# Patient Record
Sex: Male | Born: 1985 | Race: White | Hispanic: No | Marital: Married | Smoking: Former smoker
Health system: Southern US, Community
[De-identification: ages and names within clinical notes are randomized; demographics above are authoritative.]

## PROBLEM LIST (undated history)

## (undated) DIAGNOSIS — F329 Major depressive disorder, single episode, unspecified: Secondary | ICD-10-CM

## (undated) DIAGNOSIS — R519 Headache, unspecified: Secondary | ICD-10-CM

## (undated) DIAGNOSIS — F419 Anxiety disorder, unspecified: Secondary | ICD-10-CM

## (undated) DIAGNOSIS — R51 Headache: Secondary | ICD-10-CM

## (undated) DIAGNOSIS — F32A Depression, unspecified: Secondary | ICD-10-CM

## (undated) HISTORY — PX: OTHER SURGICAL HISTORY: SHX169

---

## 2014-06-06 ENCOUNTER — Other Ambulatory Visit: Payer: Self-pay | Admitting: Family Medicine

## 2014-06-06 DIAGNOSIS — R1030 Lower abdominal pain, unspecified: Secondary | ICD-10-CM

## 2014-06-07 ENCOUNTER — Ambulatory Visit
Admission: RE | Admit: 2014-06-07 | Discharge: 2014-06-07 | Disposition: A | Discharge status: Home / Self Care | Payer: No Typology Code available for payment source | Source: Ambulatory Visit | Attending: Family Medicine | Admitting: Family Medicine

## 2014-06-07 DIAGNOSIS — R1030 Lower abdominal pain, unspecified: Secondary | ICD-10-CM

## 2014-06-17 ENCOUNTER — Ambulatory Visit (INDEPENDENT_AMBULATORY_CARE_PROVIDER_SITE_OTHER): Payer: Self-pay | Admitting: General Surgery

## 2014-06-17 NOTE — H&P (Signed)
History of Present Illness Paul Filler MD; 06/17/2014 10:59 AM) Patient words: evaluate pssible bilateral inguinal hernia's.  The patient is a 29 year old male who presents with an inguinal hernia. The patient is a 29 year old male who is referred by Dr. Orvan July for an evaluation of bilateral inguinal hernias. 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 Patient is a he's had some pain both the right and left side. Patient on ultrasound which revealed a right greater than left inguinal hernia.   Other Problems Paul Stout, [place]; 06/17/2014 10:30 AM) No pertinent past medical history  Past Surgical History Paul Stout, [place]; 06/17/2014 10:30 AM) Oral Surgery  Diagnostic Studies History Paul Stout, [place]; 06/17/2014 10:30 AM) Colonoscopy never  Allergies Paul Stout, [place]; 06/17/2014 10:45 AM) No Known Drug Allergies03/10/2014  Medication History Paul Stout, [place]; 06/17/2014 10:45 AM) Ibuprofen (  Tablet, Oral) Active. (as needed for pain)  Social History Paul Stout, [place]; 06/17/2014 10:30 AM) Alcohol use Occasional alcohol use. Caffeine use Coffee. No drug use Tobacco use Never smoker.  Family History Paul Stout, [place]; 06/17/2014 10:30 AM) Family history unknown First Degree Relatives  Review of Systems Paul [place]; 06/17/2014 10:30 AM) General Not Present- Appetite Loss, Chills, Fatigue, Fever, Night Sweats, Weight Gain and Weight Loss. Skin Not Present- Change in Wart/Mole, Dryness, Hives, Jaundice, New Lesions, Non-Healing Wounds, Rash and Ulcer. HEENT Not Present- Earache, Hearing Loss, Hoarseness, Nose Bleed, Oral Ulcers, Ringing in the Ears, Seasonal Allergies, Sinus Pain, Sore Throat, Visual Disturbances, Wears glasses/contact lenses and Yellow Eyes. Respiratory Not Present- Bloody sputum, Chronic Cough, Difficulty Breathing, Snoring and Wheezing. Breast Not Present- Breast  Mass, Breast Pain, Nipple Discharge and Skin Changes. Cardiovascular Not Present- Chest Pain, Difficulty Breathing Lying Down, Leg Cramps, Palpitations, Rapid Heart Rate, Shortness of Breath and Swelling of Extremities. Gastrointestinal Present- Bloating and Excessive gas. Not Present- Abdominal Pain, Bloody Stool, Change in Bowel Habits, Chronic diarrhea, Constipation, Difficulty Swallowing, Gets full quickly at meals, Hemorrhoids, Indigestion, Nausea, Rectal Pain and Vomiting. Male Genitourinary Not Present- Blood in Urine, Change in Urinary Stream, Frequency, Impotence, Nocturia, Painful Urination, Urgency and Urine Leakage. Musculoskeletal Not Present- Back Pain, Joint Pain, Joint Stiffness, Muscle Pain, Muscle Weakness and Swelling of Extremities. Neurological Not Present- Decreased Memory, Fainting, Headaches, Numbness, Seizures, Tingling, Tremor, Trouble walking and Weakness. Psychiatric Not Present- Anxiety, Bipolar, Change in Sleep Pattern, Depression, Fearful and Frequent crying. Endocrine Not Present- Cold Intolerance, Excessive Hunger, Hair Changes, Heat Intolerance, Hot flashes and New Diabetes. Hematology Not Present- Easy Bruising, Excessive bleeding, Gland problems, HIV and Persistent Infections.   Vitals Paul Puls MA; 06/17/2014 10:45 AM) 06/17/2014 10:44 AM Weight: 167.8 lb Height: 71in Body Surface Area: 1.95 m Body Mass Index: 23.4 kg/m Temp.: 97.47F(Temporal)  Pulse: 66 (Regular)  Resp.: 16 (Unlabored)  BP: 124/86 (Sitting, Left Arm, Standard)    Physical Exam Paul Filler MD; 06/17/2014 10:59 AM) General Mental Status-Alert. General Appearance-Consistent with stated age. Hydration-Well hydrated. Voice-Normal.  Head and Neck Head-normocephalic, atraumatic with no lesions or palpable masses. Trachea-midline. Thyroid Gland Characteristics - normal size and consistency.  Chest and Lung Exam Chest and lung exam reveals -quiet, even  and easy respiratory effort with no use of accessory muscles and on auscultation, normal breath sounds, no adventitious sounds and normal vocal resonance. Inspection Chest Wall - Normal. Back - normal.  Cardiovascular Cardiovascular examination reveals -normal heart sounds, regular rate and rhythm with no murmurs and normal  pedal pulses bilaterally.  Abdomen Inspection Skin - Scar - no surgical scars. Hernias - Inguinal hernia - Bilateral - Reducible(small). Palpation/Percussion Normal exam - Soft, Non Tender, No Rebound tenderness, No Rigidity (guarding) and No hepatosplenomegaly. Auscultation Normal exam - Bowel sounds normal.    Assessment & Plan Paul Stout(Joachim Carton MD; 06/17/2014 11:00 AM) BILATERAL INGUINAL HERNIA WITHOUT OBSTRUCTION OR GANGRENE, RECURRENCE NOT SPECIFIED (550.92  K40.20) Impression: 29 year old male with bilateral inguinal hernias right greater than left  1. Will proceed to the operating for laparoscopic bilateral inguinal hernia repair with mesh. 2. All risks and benefits were discussed with the patient to generally include, but not limited to: infection, bleeding, damage to surrounding structures, acute and chronic nerve pain, and recurrence. Alternatives were offered and described. All questions were answered and the patient voiced understanding of the procedure and wishes to proceed at this point with hernia repair.

## 2014-06-19 ENCOUNTER — Encounter (HOSPITAL_COMMUNITY): Payer: Self-pay

## 2014-06-19 ENCOUNTER — Encounter (HOSPITAL_COMMUNITY)
Admission: RE | Admit: 2014-06-19 | Discharge: 2014-06-19 | Disposition: A | Discharge status: Home / Self Care | Payer: No Typology Code available for payment source | Source: Ambulatory Visit | Attending: General Surgery | Admitting: General Surgery

## 2014-06-19 DIAGNOSIS — K4020 Bilateral inguinal hernia, without obstruction or gangrene, not specified as recurrent: Secondary | ICD-10-CM | POA: Diagnosis not present

## 2014-06-19 DIAGNOSIS — Z01818 Encounter for other preprocedural examination: Secondary | ICD-10-CM | POA: Insufficient documentation

## 2014-06-19 HISTORY — DX: Headache, unspecified: R51.9

## 2014-06-19 HISTORY — DX: Headache: R51

## 2014-06-19 HISTORY — DX: Anxiety disorder, unspecified: F41.9

## 2014-06-19 HISTORY — DX: Major depressive disorder, single episode, unspecified: F32.9

## 2014-06-19 HISTORY — DX: Depression, unspecified: F32.A

## 2014-06-19 LAB — CBC
HCT: 42 % (ref 39.0–52.0)
Hemoglobin: 14.5 g/dL (ref 13.0–17.0)
MCH: 29.3 pg (ref 26.0–34.0)
MCHC: 34.5 g/dL (ref 30.0–36.0)
MCV: 84.8 fL (ref 78.0–100.0)
Platelets: 173 10*3/uL (ref 150–400)
RBC: 4.95 MIL/uL (ref 4.22–5.81)
RDW: 12.4 % (ref 11.5–15.5)
WBC: 5.6 10*3/uL (ref 4.0–10.5)

## 2014-06-19 NOTE — Pre-Procedure Instructions (Signed)
Paul Stout  06/19/2014   Your procedure is scheduled on:  06/24/2014  [facility] at [facility]:30 AM.  Call this number if you have problems the morning of surgery: [phone]   Remember:   Do not eat food or drink liquids after midnight.  On SUNDAY   Take these medicines the morning of surgery with A SIP OF WATER:  Nothing   Do not wear jewelry    Do not wear lotions, powders, or perfumes. You may wear deodorant.    Men may shave face and neck.   Do not bring valuables to the hospital.  [facility] is not responsible for any belongings or valuables.               Contacts, dentures or bridgework may not be worn into surgery.   Leave suitcase in the car. After surgery it may be brought to your room.   For patients admitted to the hospital, discharge time is determined by your treatment team.                Patients discharged the day of surgery will not be allowed to drive home.  Name and phone number of your driver:   Special Instructions: Special Instructions: [facility] - Preparing for Surgery  Before surgery, you can play an important role.  Because skin is not sterile, your skin needs to be as free of germs as possible.  You can reduce the number of germs on you skin by washing with CHG (chlorahexidine gluconate) soap before surgery.  CHG is an antiseptic cleaner which kills germs and bonds with the skin to continue killing germs even after washing.  Please DO NOT use if you have an allergy to CHG or antibacterial soaps.  If your skin becomes reddened/irritated stop using the CHG and inform your nurse when you arrive at Short Stay.  Do not shave (including legs and underarms) for at least 48 hours prior to the first CHG shower.  You may shave your face.  Please follow these instructions carefully:   1.  Shower with CHG Soap the night before surgery and the  morning of Surgery.  2.  If you choose to wash your hair, wash your hair first as usual with  your  normal shampoo.  3.  After you shampoo, rinse your hair and body thoroughly to remove the  Shampoo.  4.  Use CHG as you would any other liquid soap.  You can apply chg directly to the skin and wash gently with scrungie or a clean washcloth.  5.  Apply the CHG Soap to your body ONLY FROM THE NECK DOWN.    Do not use on open wounds or open sores.  Avoid contact with your eyes, ears, mouth and genitals (private parts).  Wash genitals (private parts)   with your normal soap.  6.  Wash thoroughly, paying special attention to the area where your surgery will be performed.  7.  Thoroughly rinse your body with warm water from the neck down.  8.  DO NOT shower/wash with your normal soap after using and rinsing off   the CHG Soap.  9.  Pat yourself dry with a clean towel.            10.  Wear clean pajamas.            11 .  Place clean sheets on your bed the night of your first shower and do not sleep with  pets.  Day of Surgery  Do not apply any lotions/deodorants the morning of surgery.  Please wear clean clothes to the hospital/surgery center.   Please read over the following fact sheets that you were given: Pain Booklet, Coughing and Deep Breathing and Surgical Site Infection Prevention

## 2014-06-23 MED ORDER — CEFAZOLIN SODIUM-DEXTROSE 2-3 GM-% IV SOLR
2.0000 g | INTRAVENOUS | Status: AC
  Administered 2014-06-24: 2 g via INTRAVENOUS
  Filled 2014-06-23: qty 50

## 2014-06-24 ENCOUNTER — Ambulatory Visit (HOSPITAL_COMMUNITY)
Admission: RE | Admit: 2014-06-24 | Discharge: 2014-06-24 | Disposition: A | Discharge status: Home / Self Care | Payer: No Typology Code available for payment source | Source: Ambulatory Visit | Attending: General Surgery | Admitting: General Surgery

## 2014-06-24 ENCOUNTER — Ambulatory Visit (HOSPITAL_COMMUNITY): Payer: No Typology Code available for payment source | Admitting: Anesthesiology

## 2014-06-24 ENCOUNTER — Encounter (HOSPITAL_COMMUNITY)
Admission: RE | Disposition: A | Discharge status: Home / Self Care | Payer: Self-pay | Source: Ambulatory Visit | Attending: General Surgery

## 2014-06-24 ENCOUNTER — Encounter (HOSPITAL_COMMUNITY): Payer: Self-pay | Admitting: *Deleted

## 2014-06-24 DIAGNOSIS — K4020 Bilateral inguinal hernia, without obstruction or gangrene, not specified as recurrent: Secondary | ICD-10-CM | POA: Diagnosis not present

## 2014-06-24 DIAGNOSIS — Z791 Long term (current) use of non-steroidal anti-inflammatories (NSAID): Secondary | ICD-10-CM | POA: Insufficient documentation

## 2014-06-24 DIAGNOSIS — Z87891 Personal history of nicotine dependence: Secondary | ICD-10-CM | POA: Insufficient documentation

## 2014-06-24 HISTORY — PX: INGUINAL HERNIA REPAIR: SHX194

## 2014-06-24 HISTORY — PX: INSERTION OF MESH: SHX5868

## 2014-06-24 SURGERY — REPAIR, HERNIA, INGUINAL, LAPAROSCOPIC
Anesthesia: General | Site: Groin | Laterality: Bilateral

## 2014-06-24 MED ORDER — BUPIVACAINE HCL (PF) 0.25 % IJ SOLN
INTRAMUSCULAR | Status: AC
  Filled 2014-06-24: qty 30

## 2014-06-24 MED ORDER — NEOSTIGMINE METHYLSULFATE 10 MG/10ML IV SOLN
INTRAVENOUS | Status: AC
  Filled 2014-06-24: qty 1

## 2014-06-24 MED ORDER — GLYCOPYRROLATE 0.2 MG/ML IJ SOLN
INTRAMUSCULAR | Status: AC
  Filled 2014-06-24: qty 1

## 2014-06-24 MED ORDER — PROPOFOL 10 MG/ML IV BOLUS
INTRAVENOUS | Status: DC | PRN
  Administered 2014-06-24: 200 mg via INTRAVENOUS

## 2014-06-24 MED ORDER — SODIUM CHLORIDE 0.9 % IJ SOLN: 3.0000 mL | INTRAMUSCULAR | Status: DC | PRN

## 2014-06-24 MED ORDER — MIDAZOLAM HCL 2 MG/2ML IJ SOLN
INTRAMUSCULAR | Status: AC
  Filled 2014-06-24: qty 2

## 2014-06-24 MED ORDER — LACTATED RINGERS IV SOLN
INTRAVENOUS | Status: DC | PRN
  Administered 2014-06-24 (×2): via INTRAVENOUS

## 2014-06-24 MED ORDER — HYDROMORPHONE HCL 1 MG/ML IJ SOLN
INTRAMUSCULAR | Status: AC
  Filled 2014-06-24: qty 1

## 2014-06-24 MED ORDER — PHENYLEPHRINE 40 MCG/ML (10ML) SYRINGE FOR IV PUSH (FOR BLOOD PRESSURE SUPPORT)
PREFILLED_SYRINGE | INTRAVENOUS | Status: AC
  Filled 2014-06-24: qty 10

## 2014-06-24 MED ORDER — ACETAMINOPHEN 650 MG RE SUPP: 650.0000 mg | RECTAL | Status: DC | PRN

## 2014-06-24 MED ORDER — ROCURONIUM BROMIDE 50 MG/5ML IV SOLN
INTRAVENOUS | Status: AC
  Filled 2014-06-24: qty 1

## 2014-06-24 MED ORDER — LIDOCAINE HCL (CARDIAC) 20 MG/ML IV SOLN
INTRAVENOUS | Status: DC | PRN
  Administered 2014-06-24: 100 mg via INTRATRACHEAL
  Administered 2014-06-24: 30 mg via INTRAVENOUS

## 2014-06-24 MED ORDER — LIDOCAINE HCL (CARDIAC) 20 MG/ML IV SOLN
INTRAVENOUS | Status: AC
  Filled 2014-06-24: qty 5

## 2014-06-24 MED ORDER — DEXAMETHASONE SODIUM PHOSPHATE 4 MG/ML IJ SOLN
INTRAMUSCULAR | Status: DC | PRN
  Administered 2014-06-24: 4 mg via INTRAVENOUS

## 2014-06-24 MED ORDER — GLYCOPYRROLATE 0.2 MG/ML IJ SOLN
INTRAMUSCULAR | Status: AC
  Filled 2014-06-24: qty 2

## 2014-06-24 MED ORDER — PROMETHAZINE HCL 25 MG/ML IJ SOLN
INTRAMUSCULAR | Status: AC
  Filled 2014-06-24: qty 1

## 2014-06-24 MED ORDER — GLYCOPYRROLATE 0.2 MG/ML IJ SOLN
INTRAMUSCULAR | Status: DC | PRN
  Administered 2014-06-24: 0.4 mg via INTRAVENOUS

## 2014-06-24 MED ORDER — PROMETHAZINE HCL 25 MG/ML IJ SOLN
6.2500 mg | INTRAMUSCULAR | Status: DC | PRN
  Administered 2014-06-24: 6.25 mg via INTRAVENOUS

## 2014-06-24 MED ORDER — OXYCODONE HCL 5 MG PO TABS
5.0000 mg | ORAL_TABLET | ORAL | Status: DC | PRN
  Administered 2014-06-24: 10 mg via ORAL
  Filled 2014-06-24: qty 2

## 2014-06-24 MED ORDER — DEXAMETHASONE SODIUM PHOSPHATE 4 MG/ML IJ SOLN
INTRAMUSCULAR | Status: AC
  Filled 2014-06-24: qty 1

## 2014-06-24 MED ORDER — MIDAZOLAM HCL 2 MG/2ML IJ SOLN: 0.5000 mg | Freq: Once | INTRAMUSCULAR | Status: DC | PRN

## 2014-06-24 MED ORDER — ROCURONIUM BROMIDE 100 MG/10ML IV SOLN
INTRAVENOUS | Status: DC | PRN
  Administered 2014-06-24: 40 mg via INTRAVENOUS

## 2014-06-24 MED ORDER — NEOSTIGMINE METHYLSULFATE 10 MG/10ML IV SOLN
INTRAVENOUS | Status: DC | PRN
  Administered 2014-06-24: 3 mg via INTRAVENOUS

## 2014-06-24 MED ORDER — FENTANYL CITRATE 0.05 MG/ML IJ SOLN
INTRAMUSCULAR | Status: DC | PRN
  Administered 2014-06-24: 250 ug via INTRAVENOUS
  Administered 2014-06-24: 50 ug via INTRAVENOUS

## 2014-06-24 MED ORDER — MEPERIDINE HCL 25 MG/ML IJ SOLN: 6.2500 mg | INTRAMUSCULAR | Status: DC | PRN

## 2014-06-24 MED ORDER — OXYCODONE-ACETAMINOPHEN 5-325 MG PO TABS: 1.0000 | ORAL_TABLET | ORAL | Status: AC | PRN

## 2014-06-24 MED ORDER — FENTANYL CITRATE 0.05 MG/ML IJ SOLN
INTRAMUSCULAR | Status: AC
  Filled 2014-06-24: qty 5

## 2014-06-24 MED ORDER — LIDOCAINE HCL (CARDIAC) 20 MG/ML IV SOLN
INTRAVENOUS | Status: AC
  Filled 2014-06-24: qty 10

## 2014-06-24 MED ORDER — CHLORHEXIDINE GLUCONATE 4 % EX LIQD: 1.0000 "application " | Freq: Once | CUTANEOUS | Status: DC

## 2014-06-24 MED ORDER — SODIUM CHLORIDE 0.9 % IV SOLN: 250.0000 mL | INTRAVENOUS | Status: DC | PRN

## 2014-06-24 MED ORDER — ONDANSETRON HCL 4 MG/2ML IJ SOLN
INTRAMUSCULAR | Status: AC
  Filled 2014-06-24: qty 2

## 2014-06-24 MED ORDER — SODIUM CHLORIDE 0.9 % IJ SOLN: 3.0000 mL | Freq: Two times a day (BID) | INTRAMUSCULAR | Status: DC

## 2014-06-24 MED ORDER — EPHEDRINE SULFATE 50 MG/ML IJ SOLN
INTRAMUSCULAR | Status: AC
  Filled 2014-06-24: qty 1

## 2014-06-24 MED ORDER — ACETAMINOPHEN 325 MG PO TABS: 650.0000 mg | ORAL_TABLET | ORAL | Status: DC | PRN

## 2014-06-24 MED ORDER — SODIUM CHLORIDE 0.9 % IR SOLN
Status: DC | PRN
  Administered 2014-06-24: 1000 mL

## 2014-06-24 MED ORDER — MIDAZOLAM HCL 5 MG/5ML IJ SOLN
INTRAMUSCULAR | Status: DC | PRN
  Administered 2014-06-24: 2 mg via INTRAVENOUS

## 2014-06-24 MED ORDER — ONDANSETRON HCL 4 MG/2ML IJ SOLN
INTRAMUSCULAR | Status: DC | PRN
  Administered 2014-06-24: 4 mg via INTRAVENOUS

## 2014-06-24 MED ORDER — 0.9 % SODIUM CHLORIDE (POUR BTL) OPTIME
TOPICAL | Status: DC | PRN
  Administered 2014-06-24: 1000 mL

## 2014-06-24 MED ORDER — BUPIVACAINE HCL 0.25 % IJ SOLN
INTRAMUSCULAR | Status: DC | PRN
  Administered 2014-06-24: 30 mL

## 2014-06-24 MED ORDER — SUCCINYLCHOLINE CHLORIDE 20 MG/ML IJ SOLN
INTRAMUSCULAR | Status: AC
  Filled 2014-06-24: qty 1

## 2014-06-24 MED ORDER — SODIUM CHLORIDE 0.9 % IJ SOLN
INTRAMUSCULAR | Status: AC
  Filled 2014-06-24: qty 10

## 2014-06-24 MED ORDER — PROPOFOL 10 MG/ML IV BOLUS
INTRAVENOUS | Status: AC
  Filled 2014-06-24: qty 20

## 2014-06-24 MED ORDER — HYDROMORPHONE HCL 1 MG/ML IJ SOLN
0.2500 mg | INTRAMUSCULAR | Status: DC | PRN
  Administered 2014-06-24 (×4): 0.5 mg via INTRAVENOUS

## 2014-06-24 MED ORDER — ACETAMINOPHEN 10 MG/ML IV SOLN
1000.0000 mg | Freq: Once | INTRAVENOUS | Status: AC
  Administered 2014-06-24: 1000 mg via INTRAVENOUS
  Filled 2014-06-24: qty 100

## 2014-06-24 SURGICAL SUPPLY — 43 items
APPLIER CLIP 5 13 M/L LIGAMAX5 (MISCELLANEOUS) ×3
BENZOIN TINCTURE PRP APPL 2/3 (GAUZE/BANDAGES/DRESSINGS) ×3 IMPLANT
CANISTER SUCTION 2500CC (MISCELLANEOUS) IMPLANT
CHLORAPREP W/TINT 26ML (MISCELLANEOUS) ×3 IMPLANT
CLIP APPLIE 5 13 M/L LIGAMAX5 (MISCELLANEOUS) ×1 IMPLANT
CLOSURE WOUND 1/2 X4 (GAUZE/BANDAGES/DRESSINGS) ×1
COVER SURGICAL LIGHT HANDLE (MISCELLANEOUS) ×3 IMPLANT
DISSECTOR BLUNT TIP ENDO 5MM (MISCELLANEOUS) IMPLANT
DRAPE LAPAROSCOPIC ABDOMINAL (DRAPES) ×3 IMPLANT
ELECT REM PT RETURN 9FT ADLT (ELECTROSURGICAL) ×3
ELECTRODE REM PT RTRN 9FT ADLT (ELECTROSURGICAL) ×1 IMPLANT
GAUZE SPONGE 2X2 8PLY STRL LF (GAUZE/BANDAGES/DRESSINGS) ×1 IMPLANT
GLOVE BIO SURGEON STRL SZ7.5 (GLOVE) ×6 IMPLANT
GLOVE BIOGEL PI IND STRL 7.0 (GLOVE) ×3 IMPLANT
GLOVE BIOGEL PI INDICATOR 7.0 (GLOVE) ×6
GLOVE SURG SS PI 7.0 STRL IVOR (GLOVE) ×3 IMPLANT
GOWN STRL REUS W/ TWL LRG LVL3 (GOWN DISPOSABLE) ×2 IMPLANT
GOWN STRL REUS W/ TWL XL LVL3 (GOWN DISPOSABLE) ×1 IMPLANT
GOWN STRL REUS W/TWL LRG LVL3 (GOWN DISPOSABLE) ×4
GOWN STRL REUS W/TWL XL LVL3 (GOWN DISPOSABLE) ×2
KIT BASIN OR (CUSTOM PROCEDURE TRAY) ×3 IMPLANT
KIT ROOM TURNOVER OR (KITS) ×3 IMPLANT
MESH 3DMAX 4X6 RT LRG (Mesh General) ×6 IMPLANT
NEEDLE INSUFFLATION 14GA 120MM (NEEDLE) ×3 IMPLANT
NS IRRIG 1000ML POUR BTL (IV SOLUTION) ×3 IMPLANT
PAD ARMBOARD 7.5X6 YLW CONV (MISCELLANEOUS) ×6 IMPLANT
RELOAD STAPLE HERNIA 4.0 BLUE (INSTRUMENTS) ×3 IMPLANT
RELOAD STAPLE HERNIA 4.8 BLK (STAPLE) IMPLANT
SCISSORS LAP 5X35 DISP (ENDOMECHANICALS) ×3 IMPLANT
SET IRRIG TUBING LAPAROSCOPIC (IRRIGATION / IRRIGATOR) IMPLANT
SET TROCAR LAP APPLE-HUNT 5MM (ENDOMECHANICALS) ×3 IMPLANT
SPONGE GAUZE 2X2 STER 10/PKG (GAUZE/BANDAGES/DRESSINGS) ×2
STAPLER HERNIA 12 8.5 360D (INSTRUMENTS) ×3 IMPLANT
STRIP CLOSURE SKIN 1/2X4 (GAUZE/BANDAGES/DRESSINGS) ×2 IMPLANT
SUT MNCRL AB 4-0 PS2 18 (SUTURE) ×3 IMPLANT
SUT VIC AB 1 CT1 27 (SUTURE)
SUT VIC AB 1 CT1 27XBRD ANBCTR (SUTURE) IMPLANT
TOWEL OR 17X24 6PK STRL BLUE (TOWEL DISPOSABLE) ×3 IMPLANT
TOWEL OR 17X26 10 PK STRL BLUE (TOWEL DISPOSABLE) ×3 IMPLANT
TRAY FOLEY CATH 16FR SILVER (SET/KITS/TRAYS/PACK) ×3 IMPLANT
TRAY LAPAROSCOPIC (CUSTOM PROCEDURE TRAY) ×3 IMPLANT
TROCAR XCEL 12X100 BLDLESS (ENDOMECHANICALS) ×3 IMPLANT
TUBING INSUFFLATION (TUBING) ×3 IMPLANT

## 2014-06-24 NOTE — Anesthesia Preprocedure Evaluation (Addendum)
Anesthesia Evaluation  Patient identified by MRN, date of birth, ID band Patient awake    Reviewed: Allergy & Precautions, NPO status , Patient's Chart, lab work & pertinent test results  History of Anesthesia Complications Negative for: history of anesthetic complications  Airway Mallampati: II  TM Distance: >3 FB Neck ROM: Full    Dental  (+) Teeth Intact, Dental Advisory Given   Pulmonary former smoker (quit '07),  breath sounds clear to auscultation        Cardiovascular negative cardio ROS  Rhythm:Regular Rate:Normal     Neuro/Psych Anxiety Depression negative neurological ROS     GI/Hepatic negative GI ROS, Neg liver ROS,   Endo/Other  negative endocrine ROS  Renal/GU negative Renal ROS     Musculoskeletal   Abdominal   Peds  Hematology negative hematology ROS (+)   Anesthesia Other Findings   Reproductive/Obstetrics                            Anesthesia Physical Anesthesia Plan  ASA: I  Anesthesia Plan: General   Post-op Pain Management:    Induction: Intravenous  Airway Management Planned: Oral ETT  Additional Equipment:   Intra-op Plan:   Post-operative Plan: Extubation in OR  Informed Consent: I have reviewed the patients History and Physical, chart, labs and discussed the procedure including the risks, benefits and alternatives for the proposed anesthesia with the patient or authorized representative who has indicated his/her understanding and acceptance.   Dental advisory given  Plan Discussed with: CRNA and Surgeon  Anesthesia Plan Comments: (Plan routine monitors, GETA)        Anesthesia Quick Evaluation

## 2014-06-24 NOTE — H&P (View-Only) (Signed)
History of Present Illness (Paul Amborn MD; 06/17/2014 10:59 AM) Patient words: evaluate pssible bilateral inguinal hernia's.  The patient is a 29 year old male who presents with an inguinal hernia. The patient is a 29-year-old male who is referred by Dr. Frances Wong for an evaluation of bilateral inguinal hernias. 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 Patient is a he's had some pain both the right and left side. Patient on ultrasound which revealed a right greater than left inguinal hernia.   Other Problems (Paul Moore, MA; 06/17/2014 10:30 AM) No pertinent past medical history  Past Surgical History (Paul Moore, MA; 06/17/2014 10:30 AM) Oral Surgery  Diagnostic Studies History (Paul Moore, MA; 06/17/2014 10:30 AM) Colonoscopy never  Allergies (Paul Moore, MA; 06/17/2014 10:45 AM) No Known Drug Allergies03/10/2014  Medication History (Paul Moore, MA; 06/17/2014 10:45 AM) Ibuprofen (200MG Tablet, Oral) Active. (as needed for pain)  Social History (Paul Moore, MA; 06/17/2014 10:30 AM) Alcohol use Occasional alcohol use. Caffeine use Coffee. No drug use Tobacco use Never smoker.  Family History (Paul Moore, MA; 06/17/2014 10:30 AM) Family history unknown First Degree Relatives  Review of Systems (Paul Moore MA; 06/17/2014 10:30 AM) General Not Present- Appetite Loss, Chills, Fatigue, Fever, Night Sweats, Weight Gain and Weight Loss. Skin Not Present- Change in Wart/Mole, Dryness, Hives, Jaundice, New Lesions, Non-Healing Wounds, Rash and Ulcer. HEENT Not Present- Earache, Hearing Loss, Hoarseness, Nose Bleed, Oral Ulcers, Ringing in the Ears, Seasonal Allergies, Sinus Pain, Sore Throat, Visual Disturbances, Wears glasses/contact lenses and Yellow Eyes. Respiratory Not Present- Bloody sputum, Chronic Cough, Difficulty Breathing, Snoring and Wheezing. Breast Not Present- Breast  Mass, Breast Pain, Nipple Discharge and Skin Changes. Cardiovascular Not Present- Chest Pain, Difficulty Breathing Lying Down, Leg Cramps, Palpitations, Rapid Heart Rate, Shortness of Breath and Swelling of Extremities. Gastrointestinal Present- Bloating and Excessive gas. Not Present- Abdominal Pain, Bloody Stool, Change in Bowel Habits, Chronic diarrhea, Constipation, Difficulty Swallowing, Gets full quickly at meals, Hemorrhoids, Indigestion, Nausea, Rectal Pain and Vomiting. Male Genitourinary Not Present- Blood in Urine, Change in Urinary Stream, Frequency, Impotence, Nocturia, Painful Urination, Urgency and Urine Leakage. Musculoskeletal Not Present- Back Pain, Joint Pain, Joint Stiffness, Muscle Pain, Muscle Weakness and Swelling of Extremities. Neurological Not Present- Decreased Memory, Fainting, Headaches, Numbness, Seizures, Tingling, Tremor, Trouble walking and Weakness. Psychiatric Not Present- Anxiety, Bipolar, Change in Sleep Pattern, Depression, Fearful and Frequent crying. Endocrine Not Present- Cold Intolerance, Excessive Hunger, Hair Changes, Heat Intolerance, Hot flashes and New Diabetes. Hematology Not Present- Easy Bruising, Excessive bleeding, Gland problems, HIV and Persistent Infections.   Vitals (Paul Moore MA; 06/17/2014 10:45 AM) 06/17/2014 10:44 AM Weight: 167.8 lb Height: 71in Body Surface Area: 1.95 m Body Mass Index: 23.4 kg/m Temp.: 97.2F(Temporal)  Pulse: 66 (Regular)  Resp.: 16 (Unlabored)  BP: 124/86 (Sitting, Left Arm, Standard)    Physical Exam (Paul Helzer MD; 06/17/2014 10:59 AM) General Mental Status-Alert. General Appearance-Consistent with stated age. Hydration-Well hydrated. Voice-Normal.  Head and Neck Head-normocephalic, atraumatic with no lesions or palpable masses. Trachea-midline. Thyroid Gland Characteristics - normal size and consistency.  Chest and Lung Exam Chest and lung exam reveals -quiet, even  and easy respiratory effort with no use of accessory muscles and on auscultation, normal breath sounds, no adventitious sounds and normal vocal resonance. Inspection Chest Wall - Normal. Back - normal.  Cardiovascular Cardiovascular examination reveals -normal heart sounds, regular rate and rhythm with no murmurs and normal   pedal pulses bilaterally.  Abdomen Inspection Skin - Scar - no surgical scars. Hernias - Inguinal hernia - Bilateral - Reducible(small). Palpation/Percussion Normal exam - Soft, Non Tender, No Rebound tenderness, No Rigidity (guarding) and No hepatosplenomegaly. Auscultation Normal exam - Bowel sounds normal.    Assessment & Plan (Paul Ramus MD; 06/17/2014 11:00 AM) BILATERAL INGUINAL HERNIA WITHOUT OBSTRUCTION OR GANGRENE, RECURRENCE NOT SPECIFIED (550.92  K40.20) Impression: 29-year-old male with bilateral inguinal hernias right greater than left  1. Will proceed to the operating for laparoscopic bilateral inguinal hernia repair with mesh. 2. All risks and benefits were discussed with the patient to generally include, but not limited to: infection, bleeding, damage to surrounding structures, acute and chronic nerve pain, and recurrence. Alternatives were offered and described. All questions were answered and the patient voiced understanding of the procedure and wishes to proceed at this point with hernia repair. 

## 2014-06-24 NOTE — Discharge Instructions (Signed)
[facility] _______[facility] ° °INGUINAL HERNIA REPAIR: POST OP INSTRUCTIONS ° °Always review your discharge instruction sheet given to you by the facility where your surgery was performed. °IF YOU HAVE DISABILITY OR FAMILY LEAVE FORMS, YOU MUST BRING THEM TO THE OFFICE FOR PROCESSING.   °DO NOT GIVE THEM TO YOUR DOCTOR. ° °1. A  prescription for pain medication may be given to you upon discharge.  Take your pain medication as prescribed, if needed.  If narcotic pain medicine is not needed, then you may take acetaminophen (Tylenol) or ibuprofen (Advil) as needed. °2. Take your usually prescribed medications unless otherwise directed. °3. If you need a refill on your pain medication, please contact your pharmacy.  They will contact our office to request authorization. Prescriptions will not be filled after 5 pm or on week-ends. °4. You should follow a light diet the first 24 hours after arrival home, such as soup and crackers, etc.  Be sure to include lots of fluids daily.  Resume your normal diet the day after surgery. °5. Most patients will experience some swelling and bruising around the umbilicus or in the groin and scrotum.  Ice packs and reclining will help.  Swelling and bruising can take several days to resolve.  °6. It is common to experience some constipation if taking pain medication after surgery.  Increasing fluid intake and taking a stool softener (such as Colace) will usually help or prevent this problem from occurring.  A mild laxative (Milk of Magnesia or Miralax) should be taken according to package directions if there are no bowel movements after 48 hours. °7. Unless discharge instructions indicate otherwise, you may remove your bandages 24-48 hours after surgery, and you may shower at that time.  You may have steri-strips (small skin tapes) in place directly over the incision.  These strips should be left on the skin for 7-10 days.  If your surgeon used skin glue on the incision, you  may shower in 24 hours.  The glue will flake off over the next 2-3 weeks.  Any sutures or staples will be removed at the office during your follow-up visit. °8. ACTIVITIES:  You may resume regular (light) daily activities beginning the next day--such as daily self-care, walking, climbing stairs--gradually increasing activities as tolerated.  You may have sexual intercourse when it is comfortable.  Refrain from any heavy lifting or straining until approved by your doctor. °a. You may drive when you are no longer taking prescription pain medication, you can comfortably wear a seatbelt, and you can safely maneuver your car and apply brakes. °b. RETURN TO WORK:  __________________________________________________________ °9. You should see your doctor in the office for a follow-up appointment approximately 2-3 weeks after your surgery.  Make sure that you call for this appointment within a day or two after you arrive home to insure a convenient appointment time. °10. OTHER INSTRUCTIONS:  __________________________________________________________________________________________________________________________________________________________________________________________  °WHEN TO CALL YOUR DOCTOR: °1. Fever over 101.0 °2. Inability to urinate °3. Nausea and/or vomiting °4. Extreme swelling or bruising °5. Continued bleeding from incision. °6. Increased pain, redness, or drainage from the incision ° °The clinic staff is available to answer your questions during regular business hours.  Please don’t hesitate to call and ask to speak to one of the nurses for clinical concerns.  If you have a medical emergency, go to the nearest emergency room or call 911.  A surgeon from [facility] is always on call at the hospital ° ° °[address], [address], [place], [place]  [address] ? ° [address] [address], [place], [place]   [address] °[phone] ? [phone] ? FAX [phone] °Web site:  [email or web address] ° °

## 2014-06-24 NOTE — Progress Notes (Signed)
Up to bathroom, voided qs; ; states post op pain better; incision unremarkable

## 2014-06-24 NOTE — Op Note (Signed)
06/24/2014  8:28 AM  PATIENT:  Paul Stout  29 y.o. male  PRE-OPERATIVE DIAGNOSIS:  BILATERAL INGUINAL HERNIA  POST-OPERATIVE DIAGNOSIS:  BILATERAL INDIRECT INGUINAL HERNIA  PROCEDURE:  Procedure(s): LAPAROSCOPIC BILATERAL INGUINAL HERNIA REPAIR WITH MESH (Bilateral) INSERTION OF MESH (Bilateral)  SURGEON:  Surgeon(s) and Role:    * Axel FillerArmando Shenea Giacobbe, MD - Primary  PHYSICIAN ASSISTANT:   ASSISTANTS: none   ANESTHESIA:   local and general  EBL: <5CC Total I/O In: 1000 [I.V.:1000] Out: -   BLOOD ADMINISTERED:none  DRAINS: none   LOCAL MEDICATIONS USED:  BUPIVICAINE   SPECIMEN:  No Specimen  DISPOSITION OF SPECIMEN:  N/A  COUNTS:  YES  TOURNIQUET:  * No tourniquets in log *  DICTATION: .Dragon Dictation   Counts: reported as correct x 2  Findings:  The patient had a small bilateral indirect hernias and weakness to the direct floor space  Indications for procedure:  The patient is a 29 year old male with a bilateral hernias for several months. Patient complained of symptomatology to his bilateral inguinal areas. The patient was taken back for elective bilateral inguinal hernia repair.  Details of the procedure: The patient was taken back to the operating room. The patient was placed in supine position with bilateral SCDs in place.  The patient was prepped and draped in the usual sterile fashion.  After appropriate anitbiotics were confirmed, a time-out was confirmed and all facts were verified.  0.25% Marcaine was used to infiltrate the umbilical area. A 11-blade was used to cut down the skin and blunt dissection was used to get the anterior fashion.  The anterior fascia was incised approximately 1 cm and the muscles were retracted laterally. Blunt dissection was then used to create a space in the preperitoneal area on the right side. At this time a 10 mm camera was then introduced into the space and advanced the pubic tubercle and a 12 mm trocar was placed over this and  insufflation was started.  At this time and space was created from medial to laterally the preperitoneal space on the right side.  Cooper's ligament was initially cleaned off.  The hernia sac was identified in the right indirect space. Dissection of the hernia sac was undertaken the vas deferens was identified and protected in all parts of the case.  There was a small tear into the hernia sac. A Veress needle right upper quadrant to help evacuate the intraperitoneal air.  Once the hernia sac was taken down to approximately the umbilicus, and the cord lipoma reduced, a Bard 3D Max mesh was  introduced into the preperitoneal space.  The mesh was brought over the direct and indirect hernia spaces.  This was anchored into place and secured to Cooper's ligament with 4.950mm staples from a Cov[place] hernia stapler. It was anchored to the anterior abdominal wall with 4.8 mm staples. The hernia sac was seen lying posterior to the mesh. There was no staples placed laterally.   The exact same dissection took place on the left side.  There was a small indirect hernia and also weakness of the direct space.  The Bard 3D max mesh was placed to cover both the direct and indirect spaces.  This was anchored into place and secured to Cooper's ligament with 4.290mm staples from a [place] hernia stapler. It was anchored to the anterior abdominal wall with 4.8 mm staples. The hernia sac was seen lying posterior to the mesh. There was no staples placed laterally.   The insufflation was evacuated. The  trochars were removed. The anterior fascia was reapproximated using #1 Vicryl on a UR- 6.  Intra-abdominal air was evacuated and the Veress needle removed. The skin was reapproximated using 4-0 Monocryl subcuticular fashion the patient was awakened from general anesthesia and taken to recovery in stable condition.   PLAN OF CARE: Discharge to home after PACU  PATIENT DISPOSITION:  PACU - hemodynamically stable.   Delay start of  Pharmacological VTE agent (>24hrs) due to surgical blood loss or risk of bleeding: not applicable

## 2014-06-24 NOTE — Anesthesia Procedure Notes (Signed)
Procedure Name: Intubation Date/Time: 06/24/2014 7:26 AM Performed by: Lovie CholOCK, Dezire Turk K Pre-anesthesia Checklist: Patient identified, Emergency Drugs available, Suction available, Patient being monitored and Timeout performed Patient Re-evaluated:Patient Re-evaluated prior to inductionOxygen Delivery Method: Circle system utilized Preoxygenation: Pre-oxygenation with 100% oxygen Intubation Type: IV induction Ventilation: Mask ventilation without difficulty Laryngoscope Size: Miller and 2 Grade View: Grade I Tube type: Oral Tube size: 7.5 mm Number of attempts: 1 Airway Equipment and Method: Stylet Placement Confirmation: ETT inserted through vocal cords under direct vision,  positive ETCO2,  CO2 detector and breath sounds checked- equal and bilateral Secured at: 22 cm Tube secured with: Tape Dental Injury: Teeth and Oropharynx as per pre-operative assessment

## 2014-06-24 NOTE — Interval H&P Note (Signed)
History and Physical Interval Note:  06/24/2014 7:16 AM  Paul Stout  has presented today for surgery, with the diagnosis of BILATERAL INGUINAL HERNIA  The various methods of treatment have been discussed with the patient and family. After consideration of risks, benefits and other options for treatment, the patient has consented to  Procedure(s): LAPAROSCOPIC BILATERAL INGUINAL HERNIA REPAIR WITH MESH (Bilateral) INSERTION OF MESH (Bilateral) as a surgical intervention .  The patient's history has been reviewed, patient examined, no change in status, stable for surgery.  I have reviewed the patient's chart and labs.  Questions were answered to the patient's satisfaction.     Marigene Ehlersamirez Jr., Jed LimerickArmando

## 2014-06-24 NOTE — Anesthesia Postprocedure Evaluation (Signed)
  Anesthesia Post-op Note  Patient: Paul Stout  Procedure(s) Performed: Procedure(s): LAPAROSCOPIC BILATERAL INGUINAL HERNIA REPAIR WITH MESH (Bilateral) INSERTION OF MESH (Bilateral)  Patient Location: PACU  Anesthesia Type:General  Level of Consciousness: awake, alert , oriented and patient cooperative  Airway and Oxygen Therapy: Patient Spontanous Breathing  Post-op Pain: mild  Post-op Assessment: Post-op Vital signs reviewed, Patient's Cardiovascular Status Stable, Respiratory Function Stable, Patent Airway and Pain level controlled, nausea improved  Post-op Vital Signs: Reviewed and stable  Last Vitals:  Filed Vitals:   06/24/14 0945  BP:   Pulse: 56  Temp:   Resp: 20    Complications: No apparent anesthesia complications

## 2014-06-24 NOTE — Transfer of Care (Signed)
Immediate Anesthesia Transfer of Care Note  Patient: Paul Stout  Procedure(s) Performed: Procedure(s): LAPAROSCOPIC BILATERAL INGUINAL HERNIA REPAIR WITH MESH (Bilateral) INSERTION OF MESH (Bilateral)  Patient Location: PACU  Anesthesia Type:General  Level of Consciousness: awake, alert , oriented and patient cooperative  Airway & Oxygen Therapy: Patient Spontanous Breathing and Patient connected to nasal cannula oxygen  Post-op Assessment: Report given to RN and Post -op Vital signs reviewed and stable  Post vital signs: Reviewed  Last Vitals:  Filed Vitals:   06/24/14 0840  BP:   Pulse: 69  Temp: 36.7 C  Resp:     Complications: No apparent anesthesia complications

## 2014-06-25 ENCOUNTER — Encounter (HOSPITAL_COMMUNITY): Payer: Self-pay | Admitting: General Surgery

## 2016-05-21 ENCOUNTER — Other Ambulatory Visit: Payer: Self-pay | Admitting: Family Medicine

## 2016-05-21 ENCOUNTER — Other Ambulatory Visit: Payer: Self-pay | Admitting: Obstetrics and Gynecology

## 2016-05-21 DIAGNOSIS — R519 Headache, unspecified: Secondary | ICD-10-CM

## 2016-05-21 DIAGNOSIS — R51 Headache: Principal | ICD-10-CM

## 2016-05-22 ENCOUNTER — Ambulatory Visit
Admission: RE | Admit: 2016-05-22 | Discharge: 2016-05-22 | Disposition: A | Discharge status: Home / Self Care | Payer: BLUE CROSS/BLUE SHIELD | Source: Ambulatory Visit | Attending: Family Medicine | Admitting: Family Medicine

## 2016-05-22 DIAGNOSIS — R51 Headache: Principal | ICD-10-CM

## 2016-05-22 DIAGNOSIS — R519 Headache, unspecified: Secondary | ICD-10-CM

## 2016-05-24 ENCOUNTER — Other Ambulatory Visit: Payer: Self-pay | Admitting: Family Medicine

## 2016-06-05 IMAGING — US US PELVIS COMPLETE
1 series · 5 of 5 positions shown · non-contrast
Comparison: None.

CLINICAL DATA: 28-year-old male with right greater than left
inguinal pain, and palpable abnormality with cough. Initial
encounter.

EXAM:
LIMITED ULTRASOUND OF PELVIS
TECHNIQUE: Limited transabdominal ultrasound examination of the pelvis was
performed.

[Series 1: us pelvis complete · 0.11mm/px · 5 acquisitions, 5 frames shown]
[im 1/5]
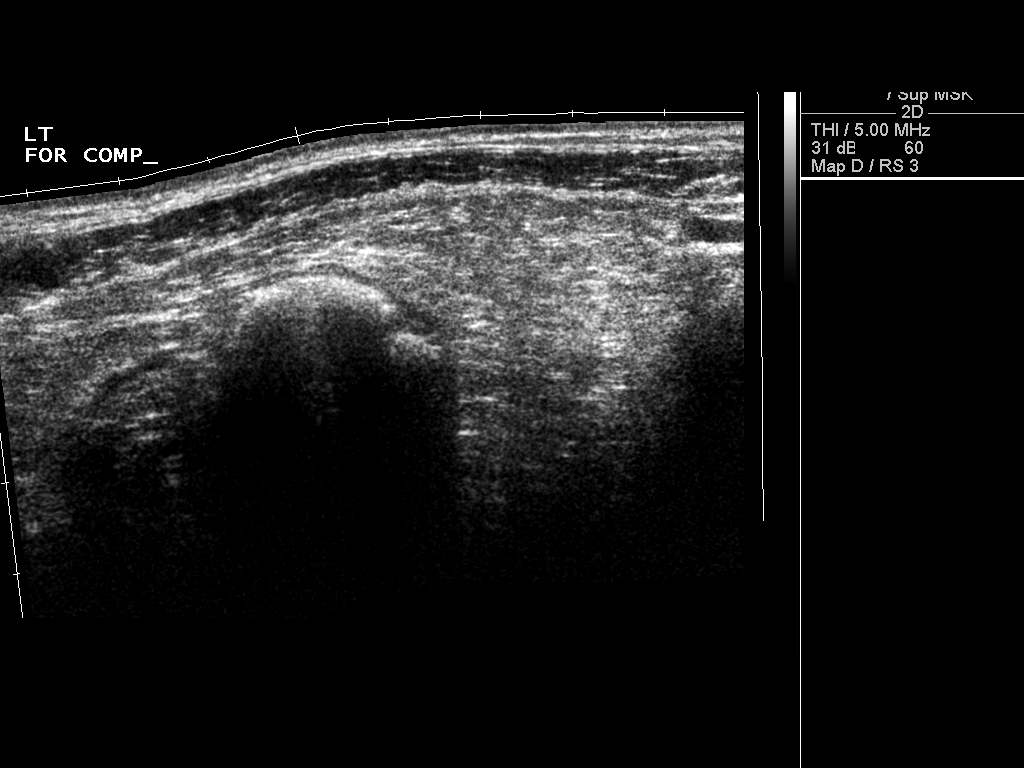
[im 2/5]
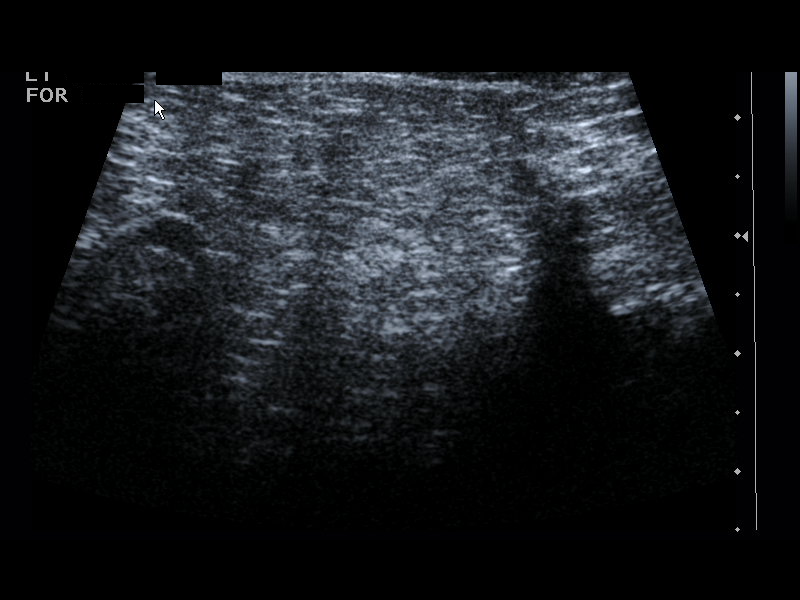
[im 3/5]
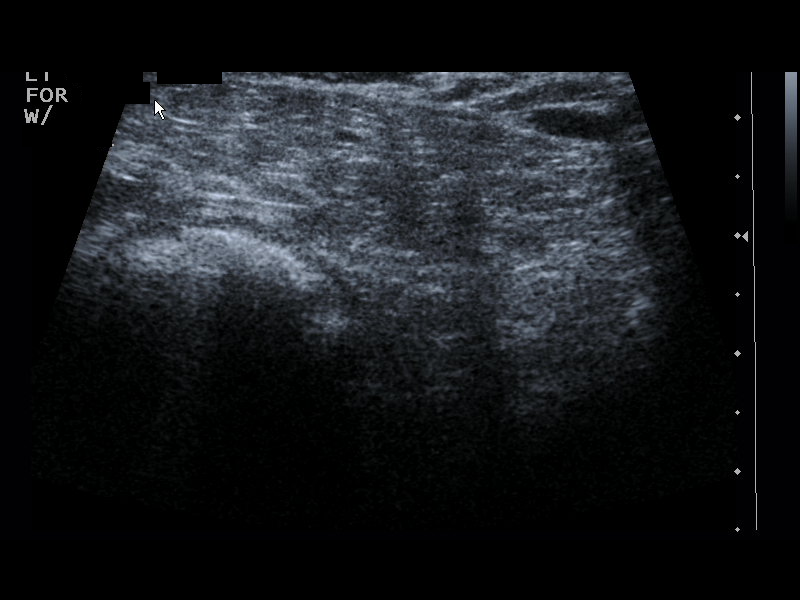
[im 4/5]
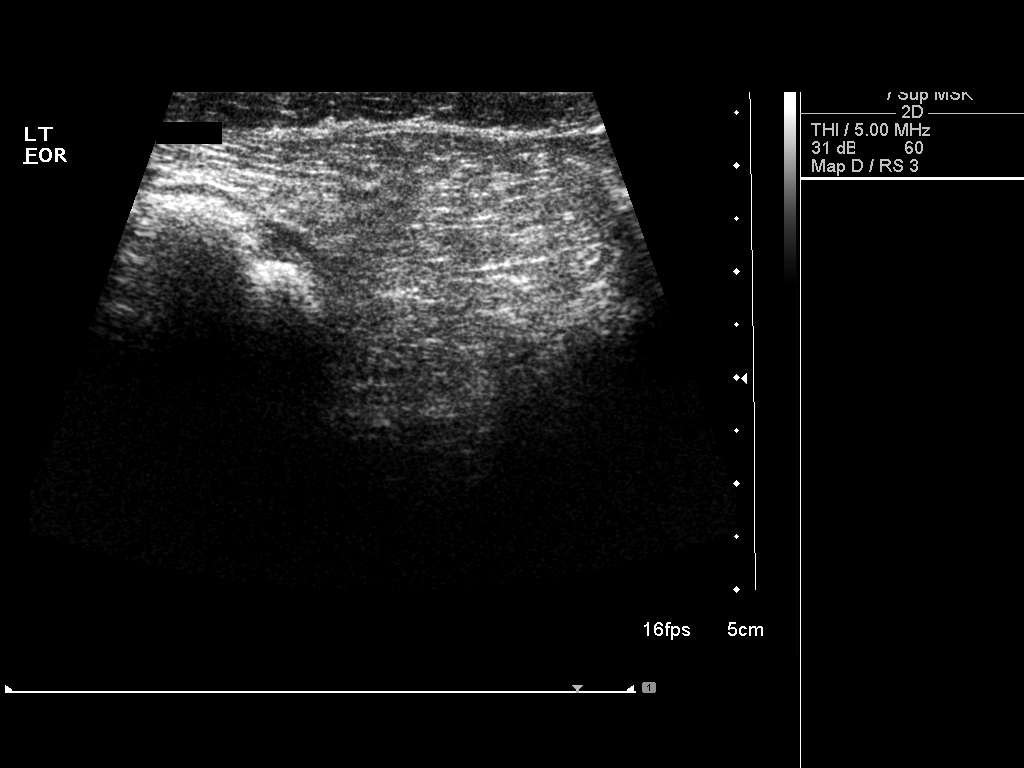
[im 5/5]
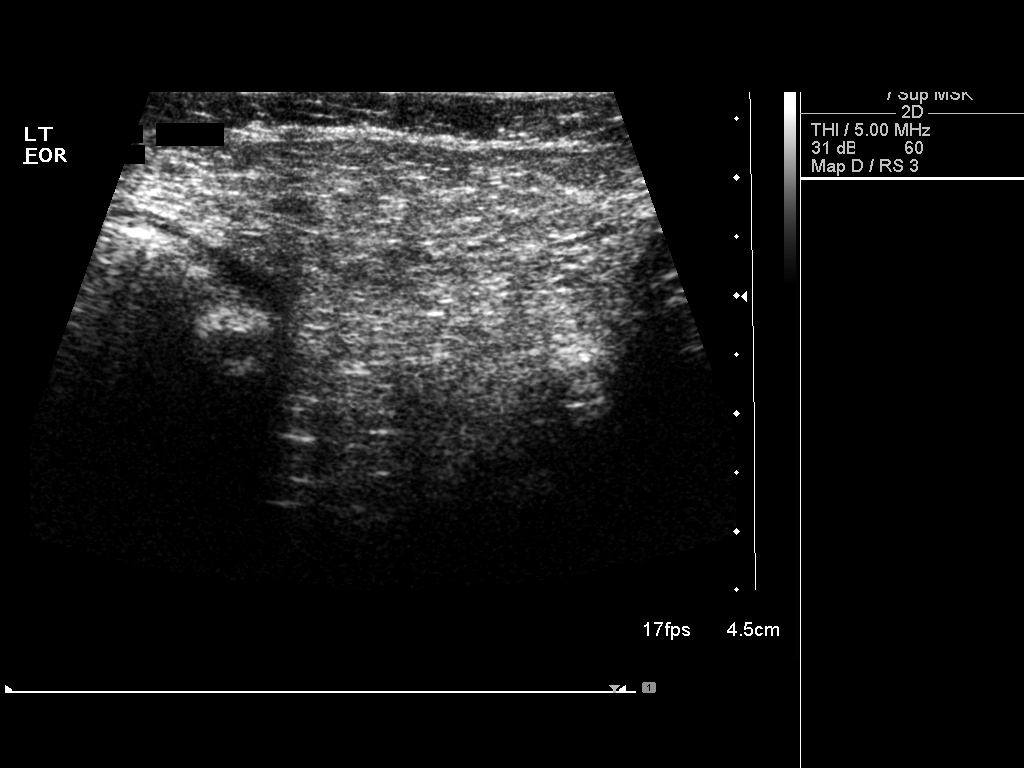

[5 of 5 positions shown; findings below may reference images not displayed]

FINDINGS: Static and cine imaging of the right inguinal region first
demonstrates peristalsis in bowel directed toward the right inguinal
ligament, which appears to contain an increase in echogenic fat.

Imaging of the contralateral left side was then performed,
demonstrating similar features but with bowel on that side not
extending as far caudally.
IMPRESSION: Suspect right greater than left mostly fat containing inguinal
hernias. Suspect marginal bowel involvement with the right hernia.
